# Patient Record
Sex: Female | Born: 1960 | Race: White | Hispanic: No | Marital: Married | Smoking: Former smoker
Health system: Southern US, Community
[De-identification: ages and names within clinical notes are randomized; demographics above are authoritative.]

## PROBLEM LIST (undated history)

## (undated) DIAGNOSIS — J45909 Unspecified asthma, uncomplicated: Secondary | ICD-10-CM

## (undated) DIAGNOSIS — M1990 Unspecified osteoarthritis, unspecified site: Secondary | ICD-10-CM

## (undated) HISTORY — PX: HEMORROIDECTOMY: SUR656

## (undated) HISTORY — DX: Unspecified osteoarthritis, unspecified site: M19.90

## (undated) HISTORY — PX: TONSILLECTOMY AND ADENOIDECTOMY: SHX28

## (undated) HISTORY — DX: Unspecified asthma, uncomplicated: J45.909

## (undated) HISTORY — PX: FOOT SURGERY: SHX648

---

## 2000-05-16 ENCOUNTER — Other Ambulatory Visit: Admission: RE | Admit: 2000-05-16 | Discharge: 2000-05-16 | Payer: Self-pay | Admitting: Obstetrics and Gynecology

## 2001-06-14 ENCOUNTER — Other Ambulatory Visit: Admission: RE | Admit: 2001-06-14 | Discharge: 2001-06-14 | Payer: Self-pay | Admitting: Obstetrics and Gynecology

## 2002-06-20 ENCOUNTER — Emergency Department (HOSPITAL_COMMUNITY): Admission: EM | Admit: 2002-06-20 | Discharge: 2002-06-20 | Payer: Self-pay | Admitting: Emergency Medicine

## 2002-06-20 ENCOUNTER — Encounter: Payer: Self-pay | Admitting: Emergency Medicine

## 2005-11-04 ENCOUNTER — Other Ambulatory Visit: Admission: RE | Admit: 2005-11-04 | Discharge: 2005-11-04 | Payer: Self-pay | Admitting: Obstetrics and Gynecology

## 2005-12-16 ENCOUNTER — Ambulatory Visit (HOSPITAL_BASED_OUTPATIENT_CLINIC_OR_DEPARTMENT_OTHER): Admission: RE | Admit: 2005-12-16 | Discharge: 2005-12-16 | Payer: Self-pay | Admitting: *Deleted

## 2011-08-30 ENCOUNTER — Other Ambulatory Visit: Payer: Self-pay | Admitting: Obstetrics and Gynecology

## 2011-12-28 ENCOUNTER — Other Ambulatory Visit: Payer: Self-pay | Admitting: Family Medicine

## 2011-12-28 DIAGNOSIS — E041 Nontoxic single thyroid nodule: Secondary | ICD-10-CM

## 2011-12-30 ENCOUNTER — Ambulatory Visit
Admission: RE | Admit: 2011-12-30 | Discharge: 2011-12-30 | Disposition: A | Discharge status: Home / Self Care | Payer: BC Managed Care – PPO | Source: Ambulatory Visit | Attending: Family Medicine | Admitting: Family Medicine

## 2011-12-30 DIAGNOSIS — E041 Nontoxic single thyroid nodule: Secondary | ICD-10-CM

## 2012-01-09 ENCOUNTER — Other Ambulatory Visit: Payer: Self-pay | Admitting: Family Medicine

## 2012-01-09 DIAGNOSIS — E041 Nontoxic single thyroid nodule: Secondary | ICD-10-CM

## 2012-01-18 ENCOUNTER — Inpatient Hospital Stay
Admission: RE | Admit: 2012-01-18 | Discharge: 2012-01-18 | Payer: BC Managed Care – PPO | Source: Ambulatory Visit | Attending: Family Medicine | Admitting: Family Medicine

## 2012-01-24 ENCOUNTER — Ambulatory Visit
Admission: RE | Admit: 2012-01-24 | Discharge: 2012-01-24 | Disposition: A | Discharge status: Home / Self Care | Payer: BC Managed Care – PPO | Source: Ambulatory Visit | Attending: Family Medicine | Admitting: Family Medicine

## 2012-01-24 ENCOUNTER — Other Ambulatory Visit (HOSPITAL_COMMUNITY)
Admission: RE | Admit: 2012-01-24 | Discharge: 2012-01-24 | Disposition: A | Discharge status: Home / Self Care | Payer: BC Managed Care – PPO | Source: Ambulatory Visit | Attending: Interventional Radiology | Admitting: Interventional Radiology

## 2012-01-24 DIAGNOSIS — E041 Nontoxic single thyroid nodule: Secondary | ICD-10-CM

## 2012-01-24 DIAGNOSIS — E049 Nontoxic goiter, unspecified: Secondary | ICD-10-CM | POA: Insufficient documentation

## 2013-01-09 ENCOUNTER — Other Ambulatory Visit: Payer: Self-pay | Admitting: Family Medicine

## 2013-01-09 DIAGNOSIS — E041 Nontoxic single thyroid nodule: Secondary | ICD-10-CM

## 2013-01-17 ENCOUNTER — Other Ambulatory Visit: Payer: BC Managed Care – PPO

## 2013-01-24 ENCOUNTER — Ambulatory Visit
Admission: RE | Admit: 2013-01-24 | Discharge: 2013-01-24 | Disposition: A | Discharge status: Home / Self Care | Payer: BC Managed Care – PPO | Source: Ambulatory Visit | Attending: Family Medicine | Admitting: Family Medicine

## 2013-01-24 DIAGNOSIS — E041 Nontoxic single thyroid nodule: Secondary | ICD-10-CM

## 2013-10-30 IMAGING — US US SOFT TISSUE HEAD/NECK
1 series · 14 of 25 positions shown · non-contrast
Comparison: None.

CLINICAL DATA: Palpable left sided nodule

THYROID ULTRASOUND
TECHNIQUE: Ultrasound examination of the thyroid gland and adjacent
soft tissues was performed.

[Series 1: us soft tissue head/neck · 0.03mm/px · 14 of 55 slices shown]
[im 1/55]
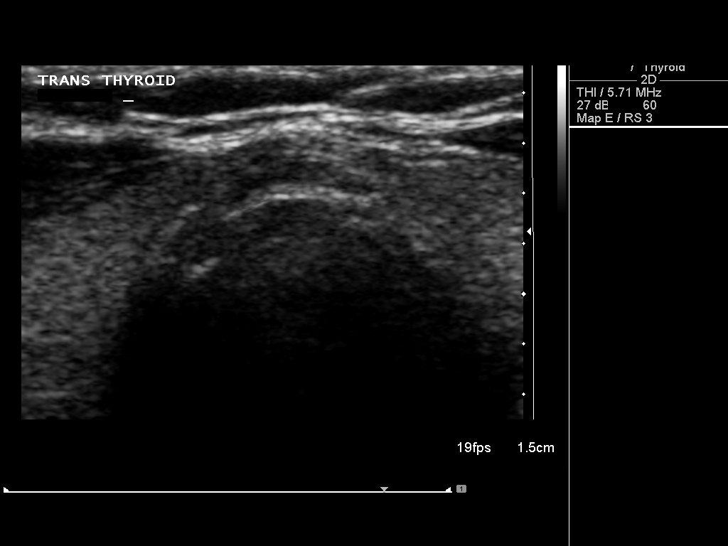
[im 5/55]
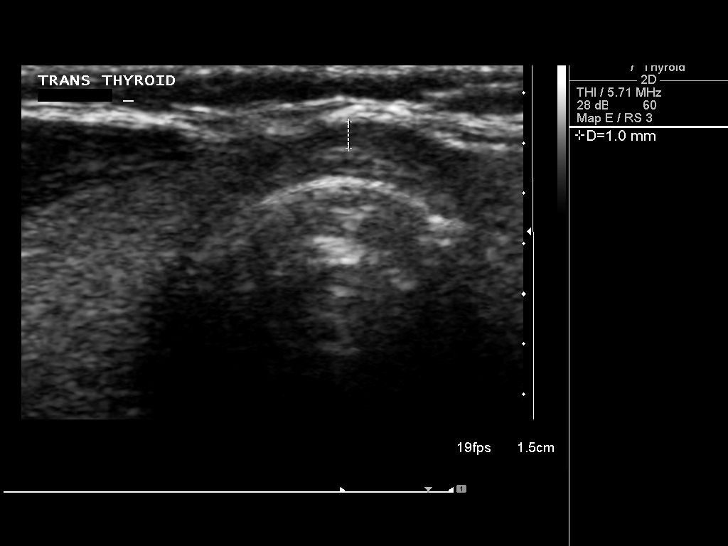
[im 10/55]
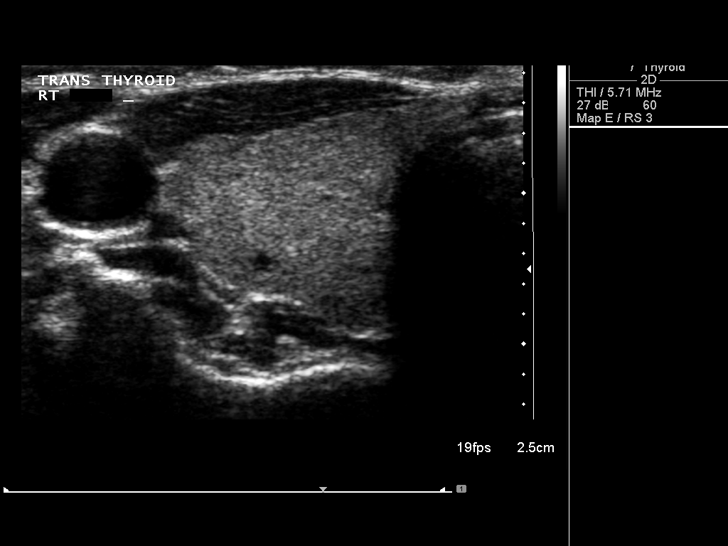
[im 14/55]
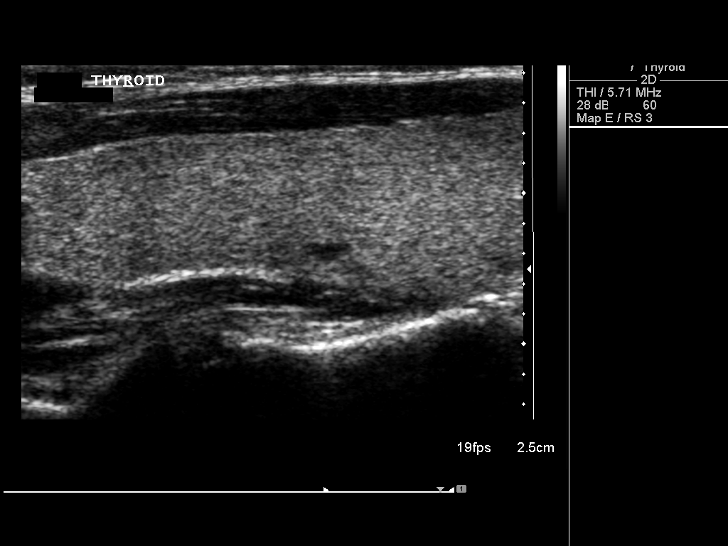
[im 19/55]
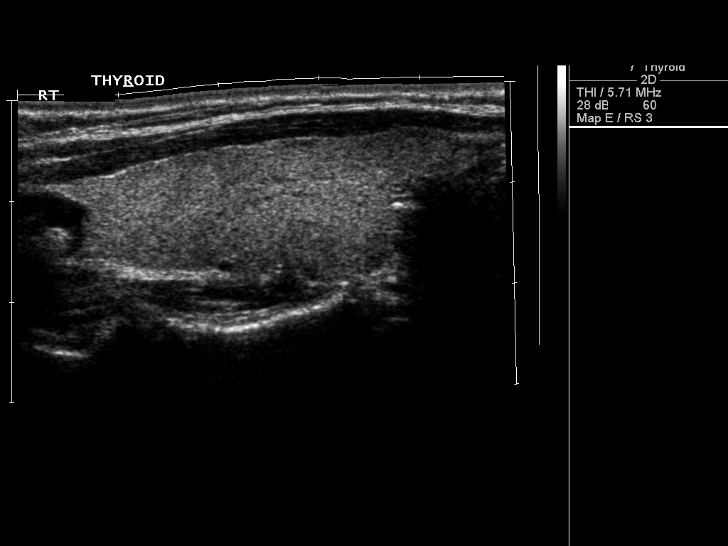
[im 21/55]
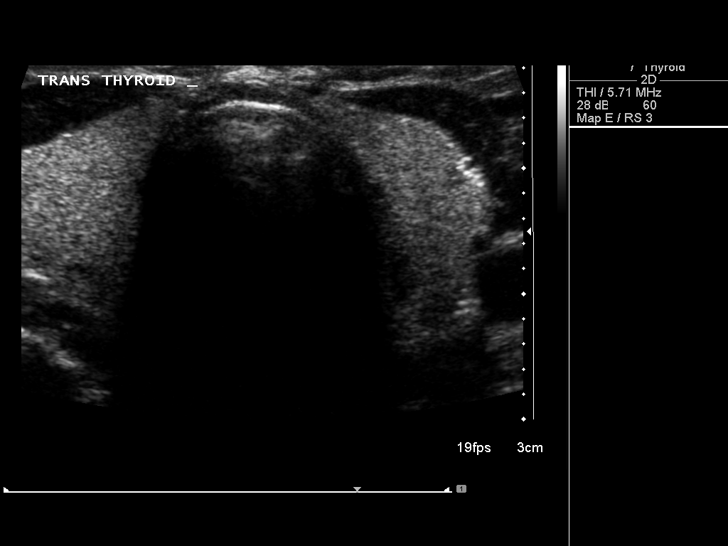
[im 25/55]
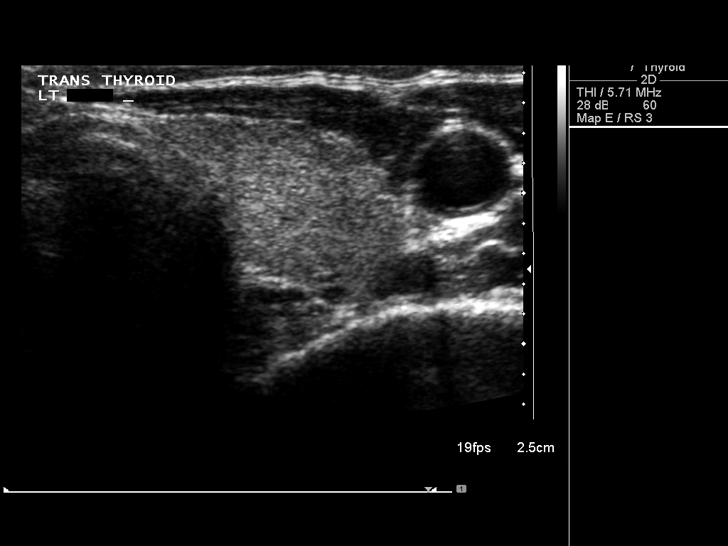
[im 30/55]
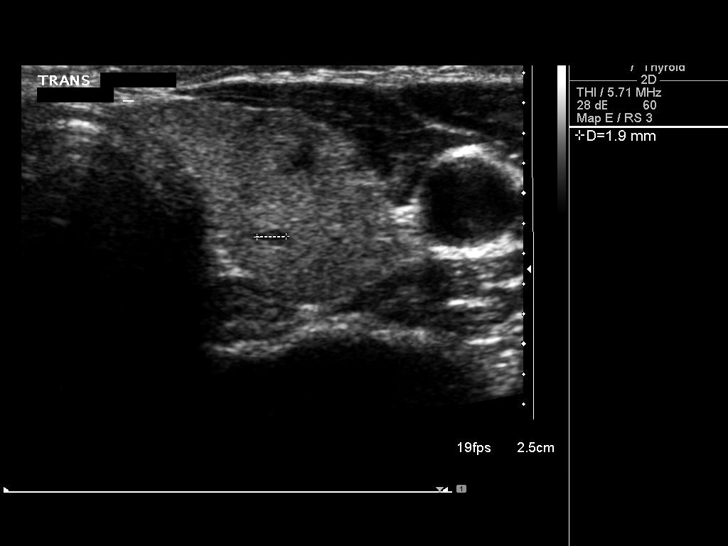
[im 34/55]
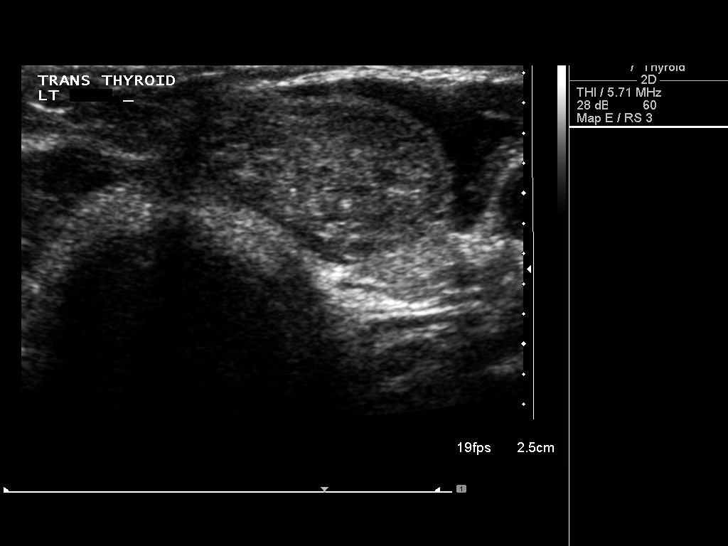
[im 37/55]
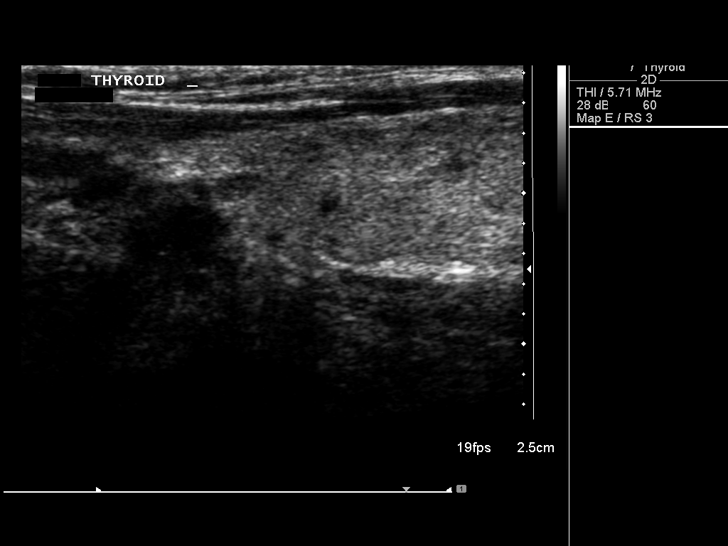
[im 41/55]
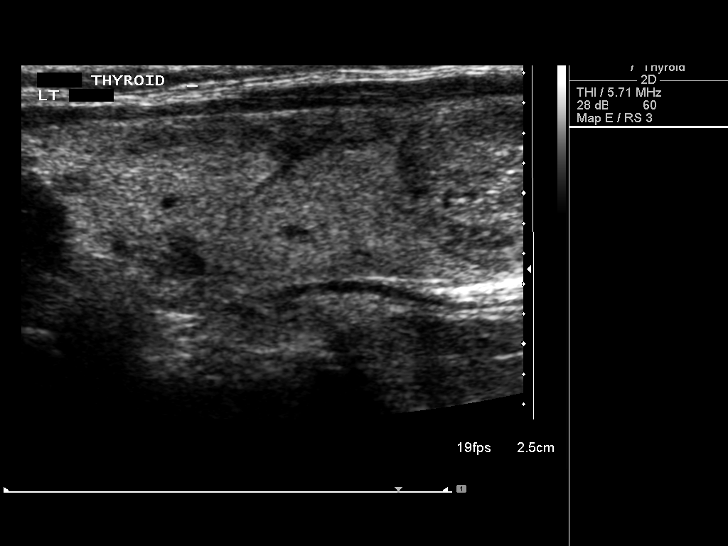
[im 46/55]
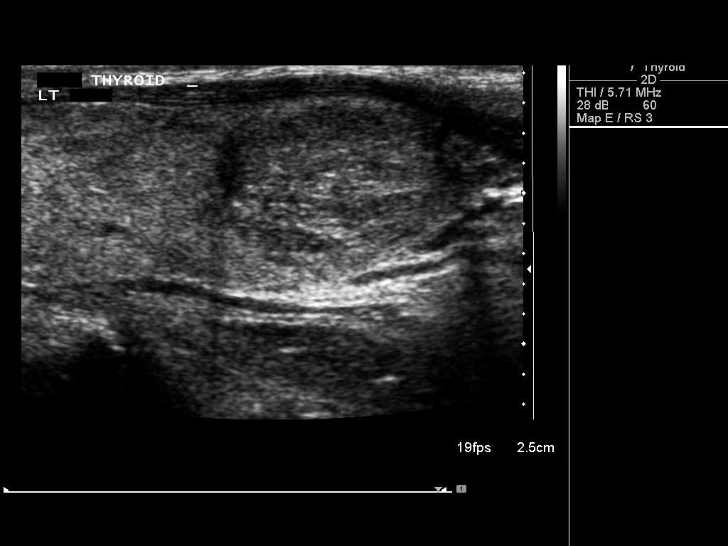
[im 50/55]
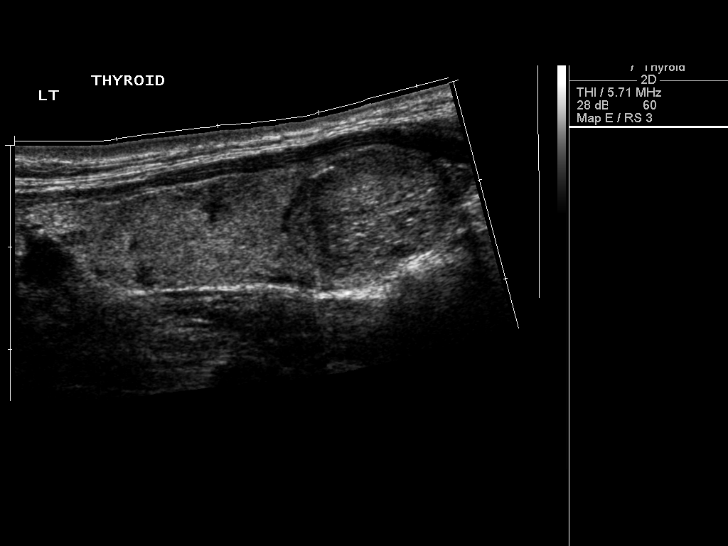
[im 55/55]
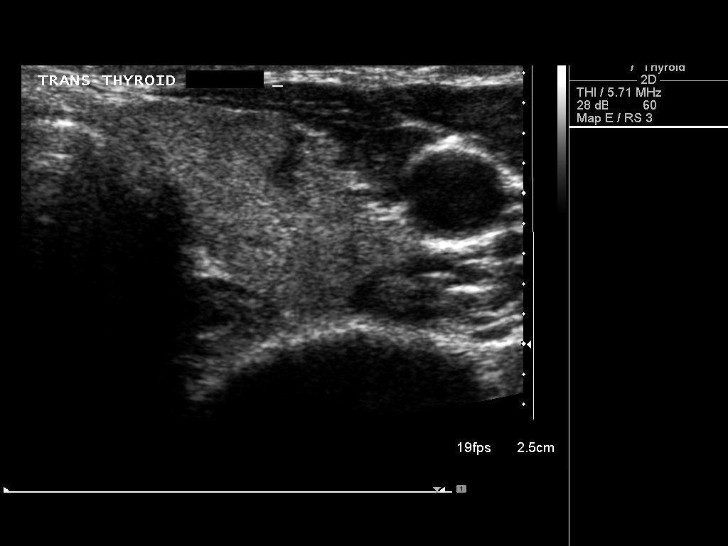

[14 of 25 positions shown; findings below may reference images not displayed]

FINDINGS: Right thyroid lobe:  14 x 15 x 41 mm, homogeneous in echotexture
Left thyroid lobe:  13 x 14 x 42 mm
Isthmus:  1 mm in thickness

Focal nodules:  11 x 16 x 17 mm solid with microcalcifications,
inferior left

Lymphadenopathy:  None visualized.
IMPRESSION: 1.  Dominant 17 mm left thyroid nodule with microcalcifications.
Findings meet consensus criteria for biopsy.  Ultrasound-guided
fine needle aspiration should be considered, as per the consensus
statement: Management of Thyroid Nodules Detected at US:  Society
of Radiologists in Ultrasound Consensus Conference Statement.

## 2013-10-31 ENCOUNTER — Other Ambulatory Visit: Payer: Self-pay | Admitting: Obstetrics and Gynecology

## 2013-11-24 IMAGING — US US THYROID BIOPSY
1 series · 11 of 11 positions shown · non-contrast
Comparison: 12/30/2011

CLINICAL DATA: 1.7 cm solid nodule in the left lobe of the thyroid
gland.

ULTRASOUND GUIDED NEEDLE ASPIRATE BIOPSY OF THE THYROID GLAND

[Series 1: us thyroid biopsy · 0.05mm/px · 11 acquisitions, 11 frames shown]
[im 1/11]
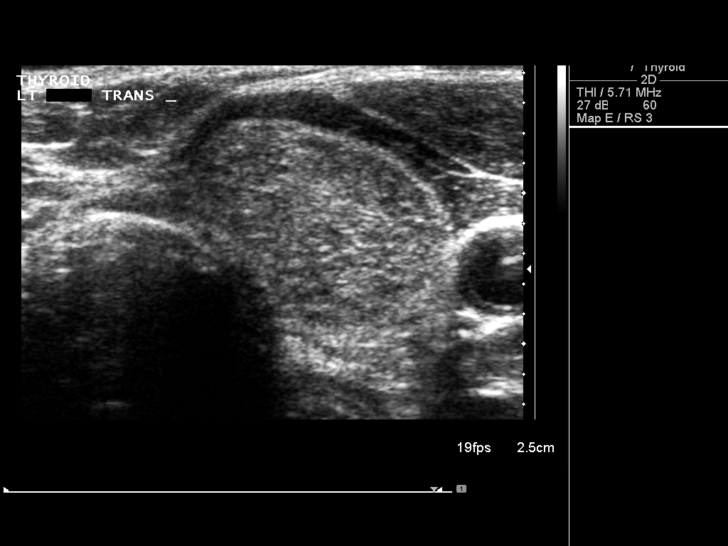
[im 2/11]
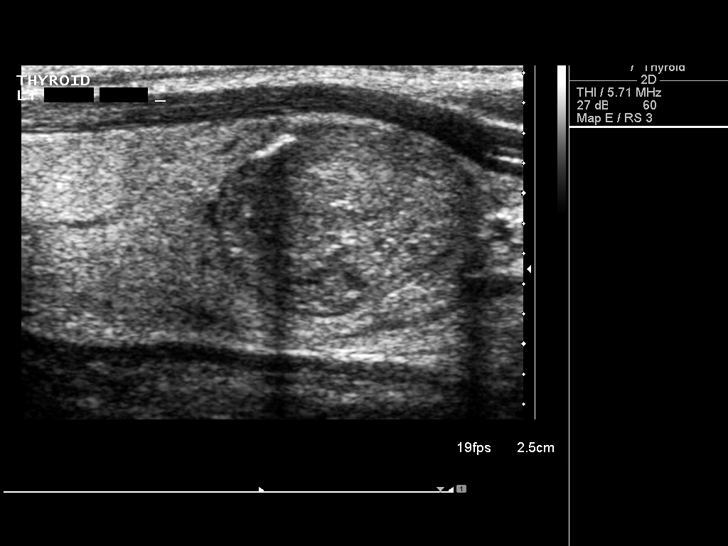
[im 3/11]
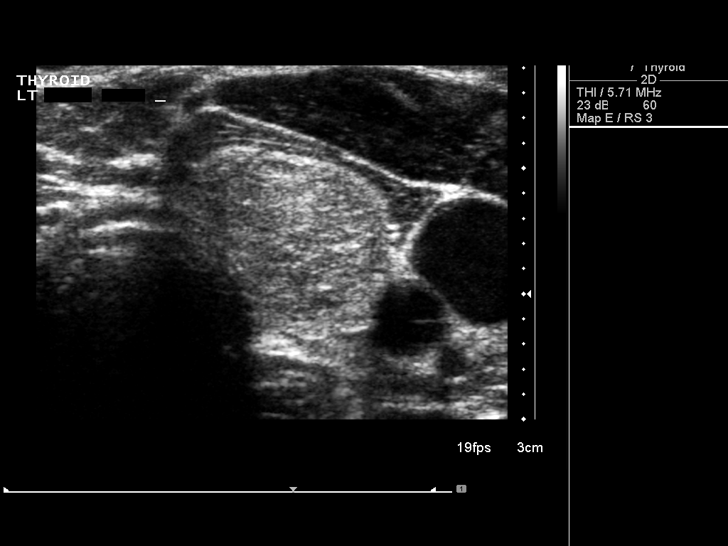
[im 4/11]
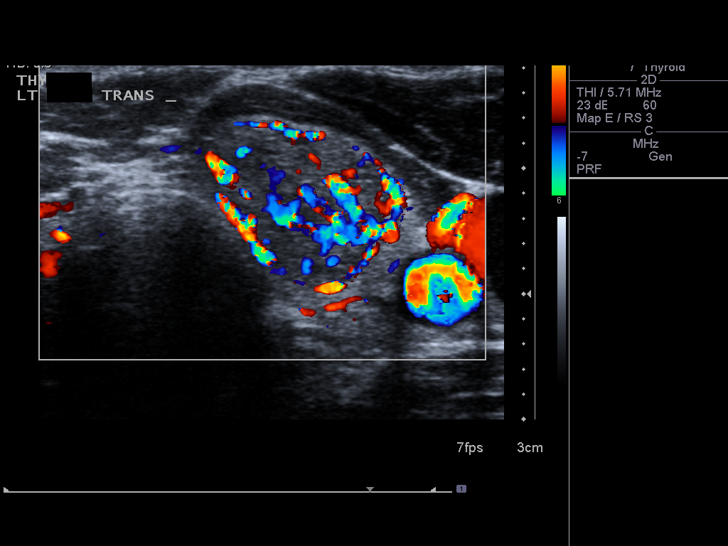
[im 5/11]
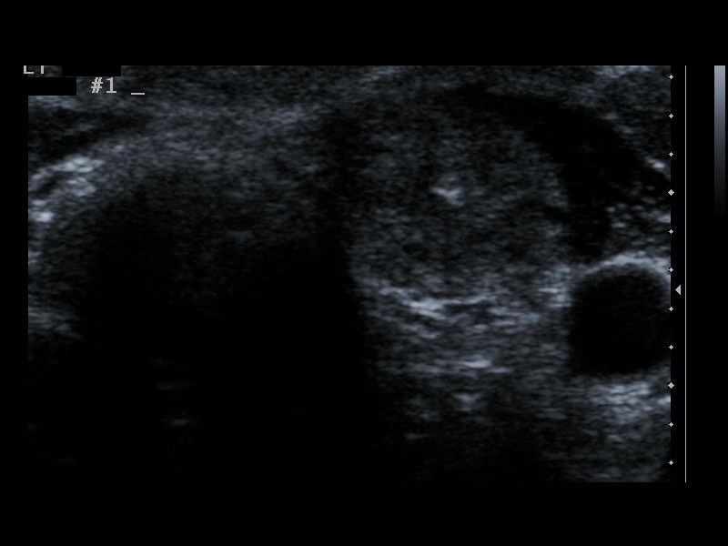
[im 6/11]
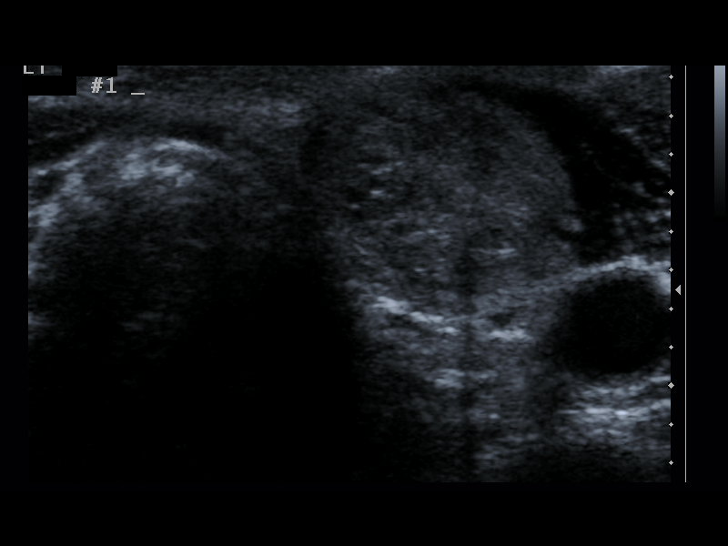
[im 7/11]
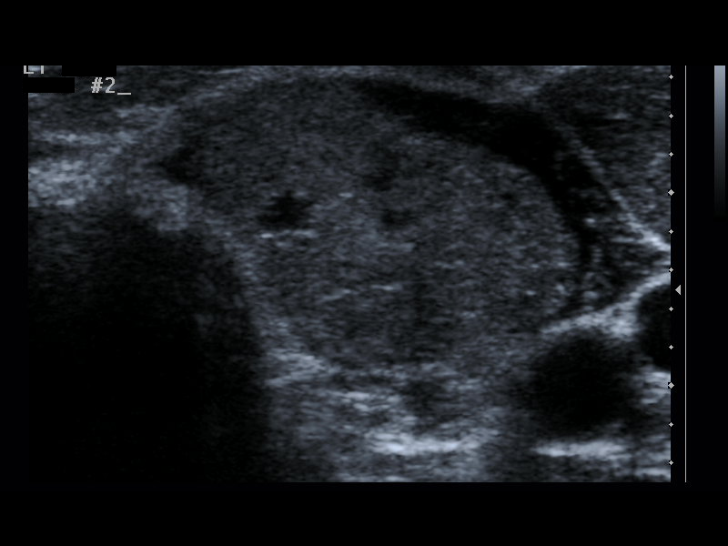
[im 8/11]
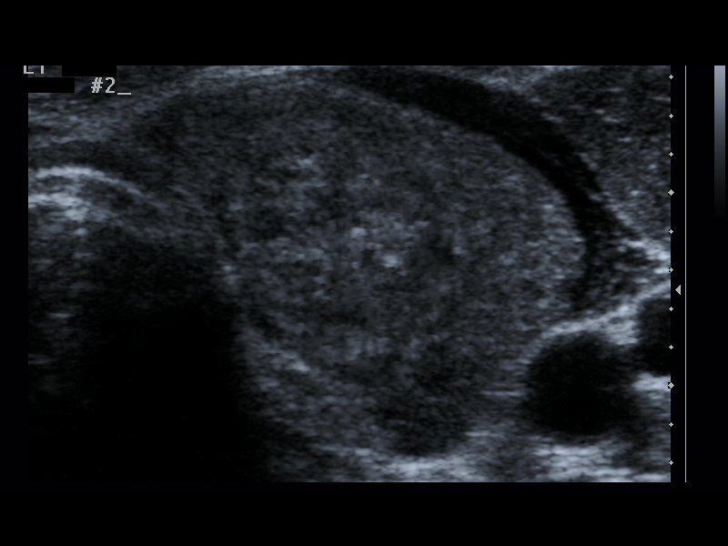
[im 9/11]
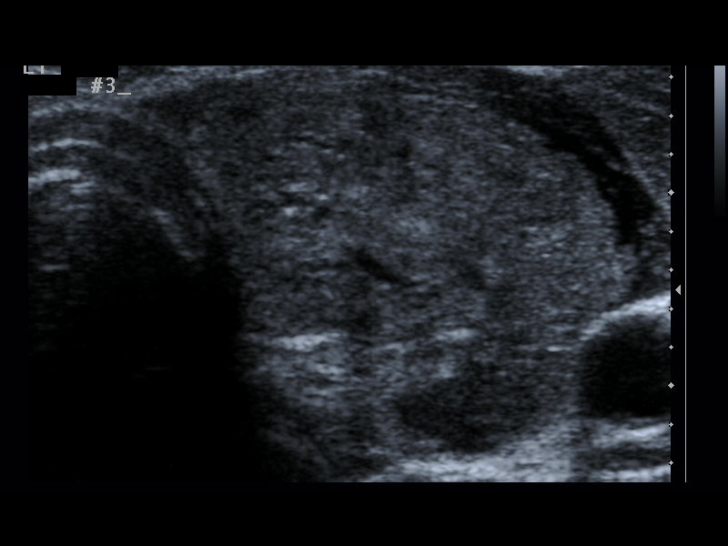
[im 10/11]
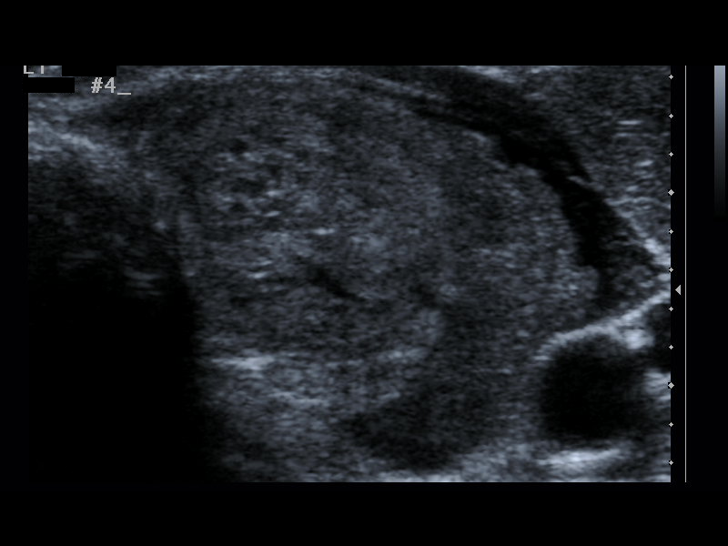
[im 11/11]
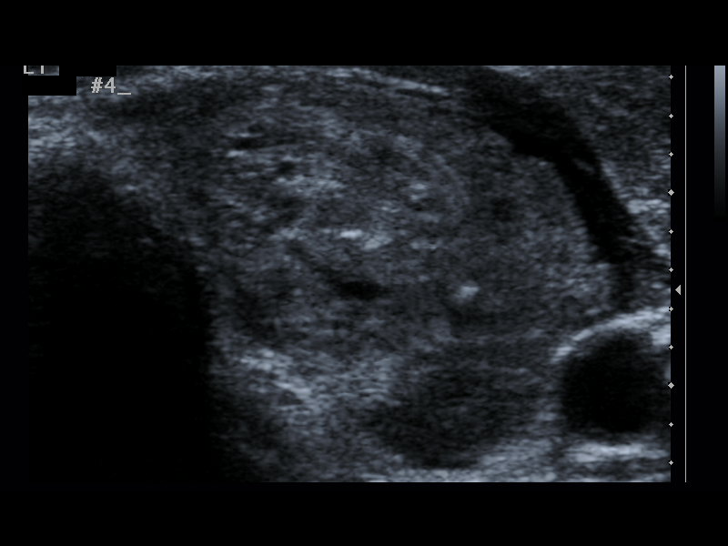

[11 of 11 positions shown; findings below may reference images not displayed]

Thyroid biopsy was thoroughly discussed with the patient and
questions were answered.  The benefits, risks, alternatives, and
complications were also discussed.  The patient understands and
wishes to proceed with the procedure.  Written consent was
obtained.

Ultrasound was performed to localize and mark an adequate site for
the biopsy.  The patient was then prepped and draped in a normal
sterile fashion.  Local anesthesia was provided with 1% lidocaine.
Using direct ultrasound guidance, 4 passes were made using 25 gauge
needles into the nodule within the left lobe of the thyroid.
Ultrasound was used to confirm needle placements on all occasions.
Specimens were sent to Pathology for analysis.

Complications:  Mild amount of hemorrhage developing around the
thyroid gland.
FINDINGS: Needle passes were obtained in different portions of the
dominant left thyroid nodule. During the procedure ultrasound did
show a mild amount of hemorrhage developing superficial to the
thyroid gland.  This was treated with compression and an ice pack
after the procedure.  The patient was not symptomatic.
IMPRESSION: Ultrasound guided needle aspirate biopsy performed of the left
thyroid nodule.

## 2014-03-25 ENCOUNTER — Ambulatory Visit (INDEPENDENT_AMBULATORY_CARE_PROVIDER_SITE_OTHER): Payer: BC Managed Care – PPO | Admitting: Surgery

## 2014-03-26 ENCOUNTER — Ambulatory Visit (INDEPENDENT_AMBULATORY_CARE_PROVIDER_SITE_OTHER): Payer: BC Managed Care – PPO | Admitting: General Surgery

## 2014-03-26 ENCOUNTER — Encounter (INDEPENDENT_AMBULATORY_CARE_PROVIDER_SITE_OTHER): Payer: Self-pay | Admitting: General Surgery

## 2014-03-26 VITALS — BP 124/80 | HR 64 | Temp 98.0°F | Ht 65.0 in | Wt 141.0 lb

## 2014-03-26 DIAGNOSIS — K6289 Other specified diseases of anus and rectum: Secondary | ICD-10-CM

## 2014-03-26 MED ORDER — HYDROCORTISONE ACETATE 25 MG RE SUPP: 25.0000 mg | Freq: Two times a day (BID) | RECTAL | Status: DC

## 2014-03-26 NOTE — Progress Notes (Signed)
Chief Complaint  Patient presents with  . eval hems    HISTORY: Elizabeth Mullen is a 53 y.o. female who presents to the office with anal discomfort after BM's.  Other symptoms include nothing.  This had been occurring for several years.  she has tried nothing in the past.  Nothing makes the symptoms worse.   It is continuous in nature.  her bowel habits are regular and her bowel movements are usually soft.  her fiber intake is dietary.  She eats plenty of vegetables every day.  her last colonoscopy was 3 years ago and is due again in 7 yrs.       Past Medical History  Diagnosis Date  . Arthritis   . Asthma       Past Surgical History  Procedure Laterality Date  . Cesarean section    . Tonsillectomy and adenoidectomy    . Foot surgery    . Hemorroidectomy         Back surgery: 1986  Current Outpatient Prescriptions  Medication Sig Dispense Refill  . HYDROcodone-acetaminophen (NORCO/VICODIN) 5-325 MG per tablet       . temazepam (RESTORIL) 30 MG capsule        No current facility-administered medications for this visit.      Allergies  Allergen Reactions  . Contrast Media [Iodinated Diagnostic Agents]       History reviewed. No pertinent family history.  History   Social History  . Marital Status: Married    Spouse Name: N/A    Number of Children: N/A  . Years of Education: N/A   Social History Main Topics  . Smoking status: Former Smoker    Types: Cigarettes    Quit date: 03/27/1999  . Smokeless tobacco: None  . Alcohol Use: Yes  . Drug Use: No  . Sexual Activity: None   Other Topics Concern  . None   Social History Narrative  . None      REVIEW OF SYSTEMS - PERTINENT POSITIVES ONLY: Review of Systems - General ROS: negative for - chills, fever or weight loss Hematological and Lymphatic ROS: negative for - bleeding problems, blood clots or bruising Respiratory ROS: no cough, shortness of breath, or wheezing Cardiovascular ROS: no chest pain or dyspnea on  exertion Gastrointestinal ROS: no abdominal pain, change in bowel habits, or black or bloody stools Genito-Urinary ROS: no dysuria, trouble voiding, or hematuria  EXAM: Filed Vitals:   03/26/14 1541  BP: 124/80  Pulse: 64  Temp: 98 F (36.7 C)    General appearance: alert and cooperative Resp: clear to auscultation bilaterally Cardio: regular rate and rhythm GI: soft, non-tender; bowel sounds normal; no masses,  no organomegaly  Procedure: Anoscopy Surgeon: Maisie Fushomas Diagnosis: anal discomfort  Assistant: Felix AhmadiStaten After the risks and benefits were explained, verbal consent was obtained for above procedure  Anesthesia: none Findings: inflamed, grade 1 internal hemorrhoids, moderate non-inflamed external skin tags in a circumferential fashion    ASSESSMENT AND PLAN: Elizabeth MeringSharee Henton is a 53 y.o. female with external skin tag and inflamed grade 1 internal hemorrhoids and large recurrent skin tags after her hemorrhoidectomy several years ago.  Given that her inflammation is mostly internal, I will have her try anusol suppositories first.  It appears that she is getting a good amount of fiber and liquids. I will see her back in 2 months.       Vanita PandaAlicia C Thomas, MD Colon and Rectal Surgery / General Surgery [facility]  Visit Diagnoses: No diagnosis found.  Primary Care Physician: No primary provider on file.

## 2014-03-26 NOTE — Patient Instructions (Addendum)

## 2014-05-27 ENCOUNTER — Encounter (INDEPENDENT_AMBULATORY_CARE_PROVIDER_SITE_OTHER): Payer: BC Managed Care – PPO | Admitting: General Surgery

## 2014-11-25 IMAGING — US US SOFT TISSUE HEAD/NECK
1 series · 14 of 25 positions shown · non-contrast
Comparison: Thyroid ultrasound 12/30/2011

CLINICAL DATA: Follow up thyroid nodule

THYROID ULTRASOUND
TECHNIQUE: Ultrasound examination of the thyroid gland and adjacent
soft tissues was performed.

[Series 1: us soft tissue head/neck · 0.06mm/px · 14 of 45 slices shown]
[im 1/45]
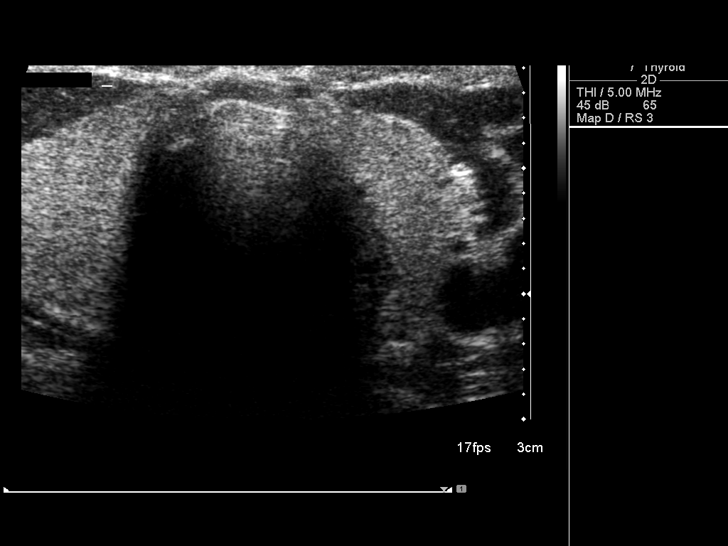
[im 4/45]
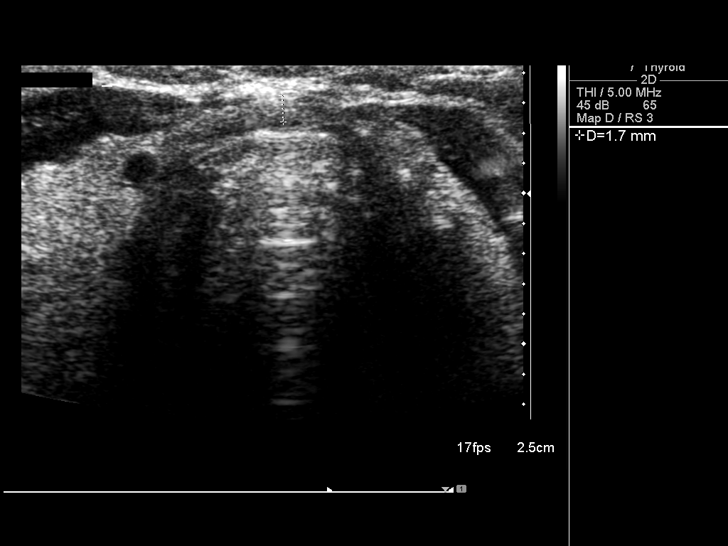
[im 8/45]
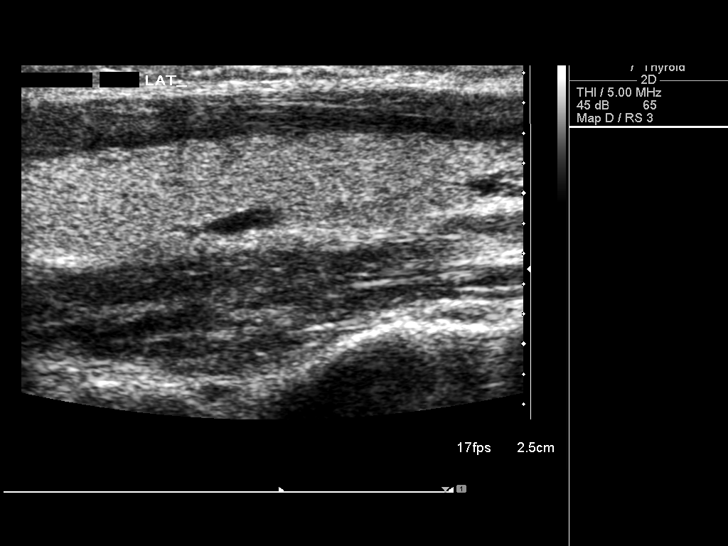
[im 12/45]
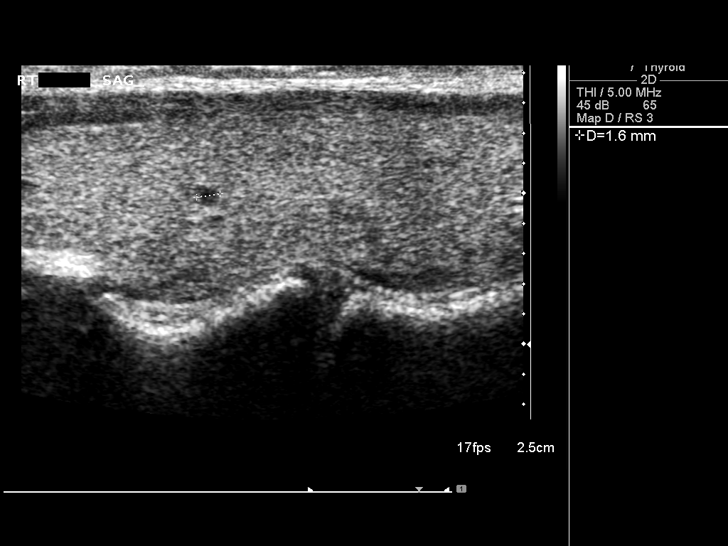
[im 15/45]
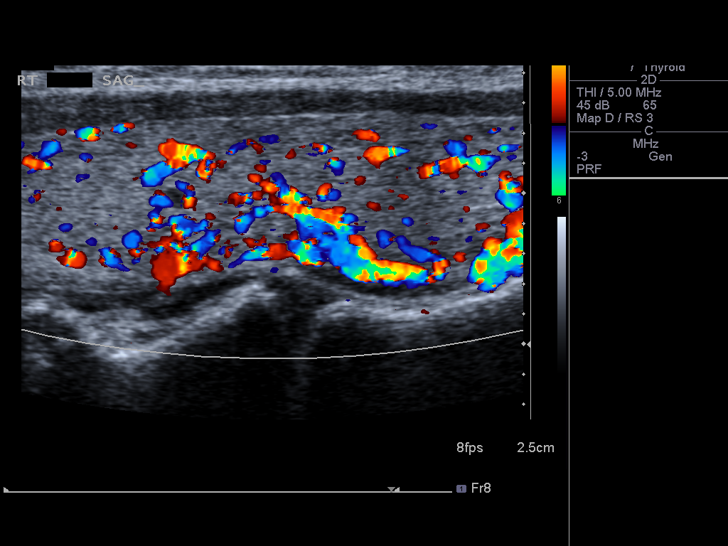
[im 17/45]
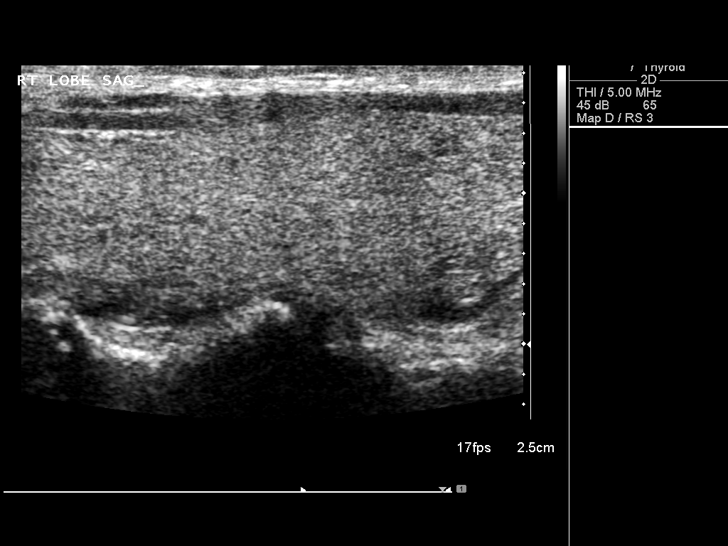
[im 21/45]
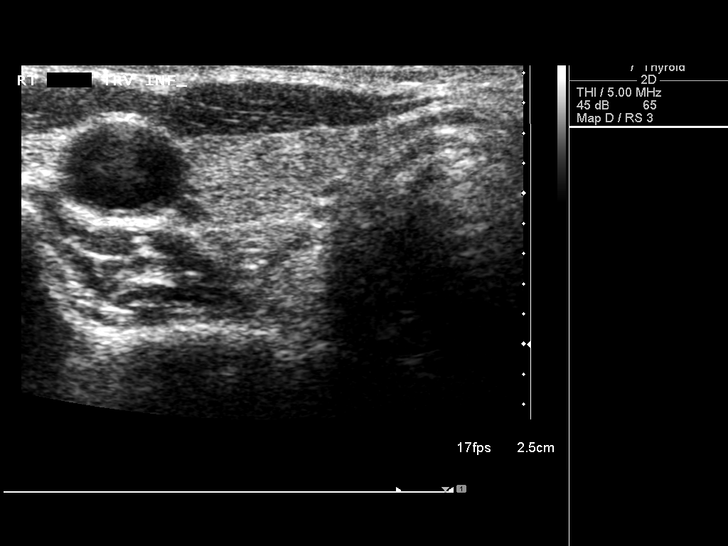
[im 24/45]
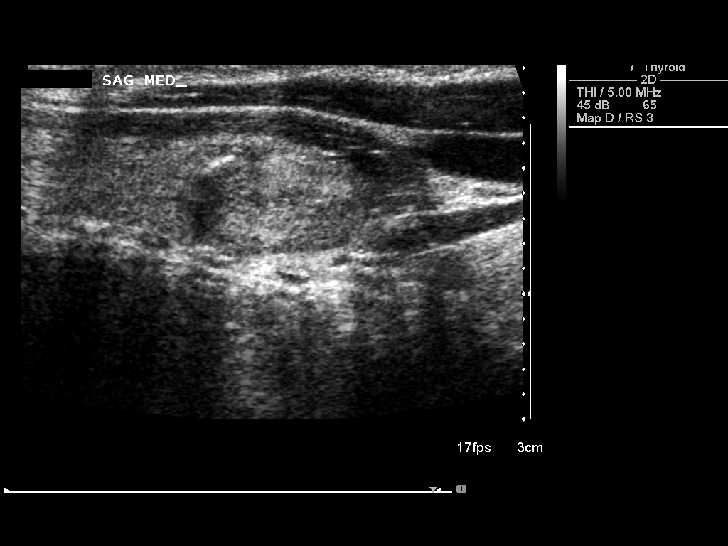
[im 28/45]
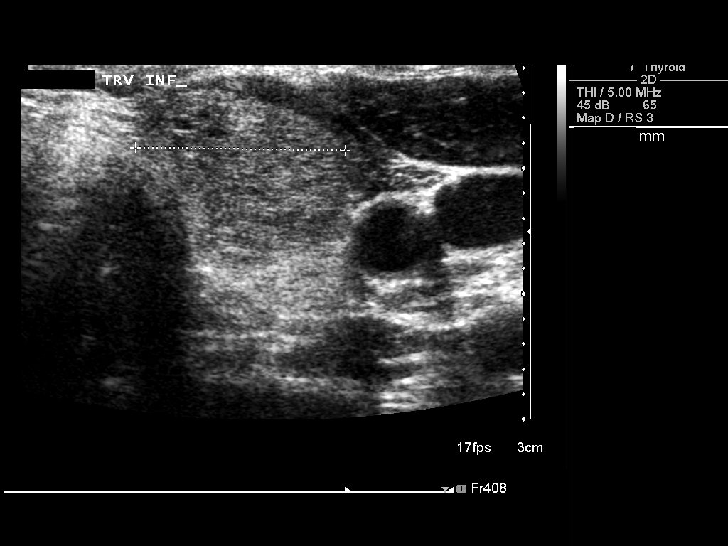
[im 30/45]
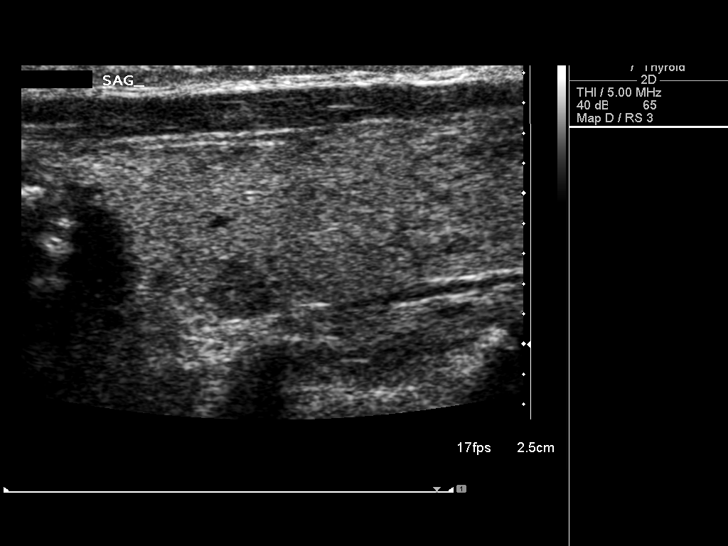
[im 34/45]
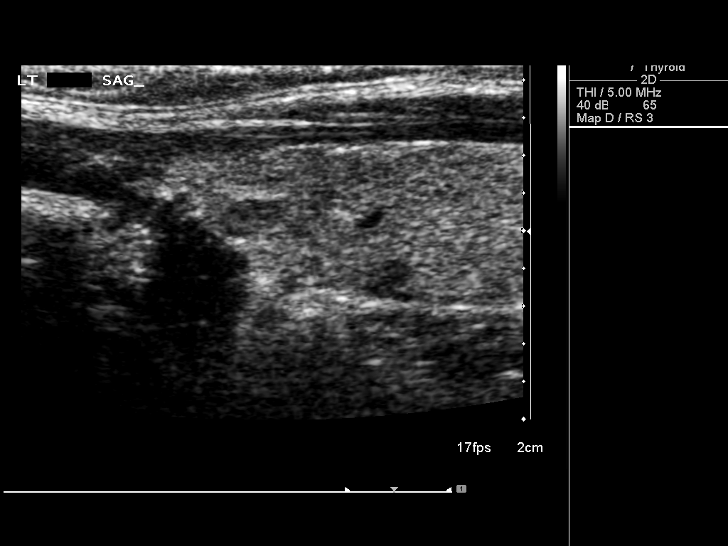
[im 37/45]
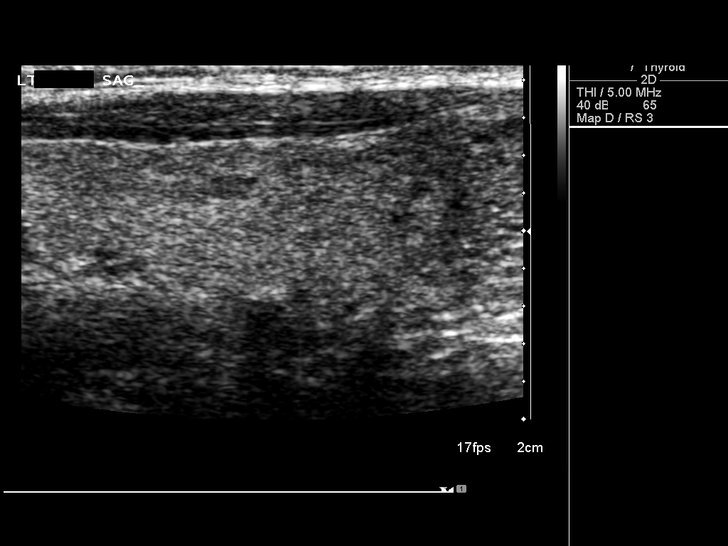
[im 41/45]
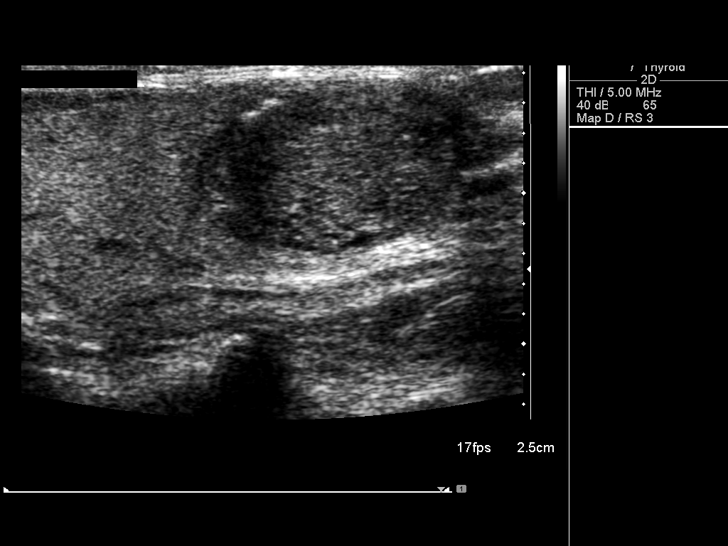
[im 45/45]
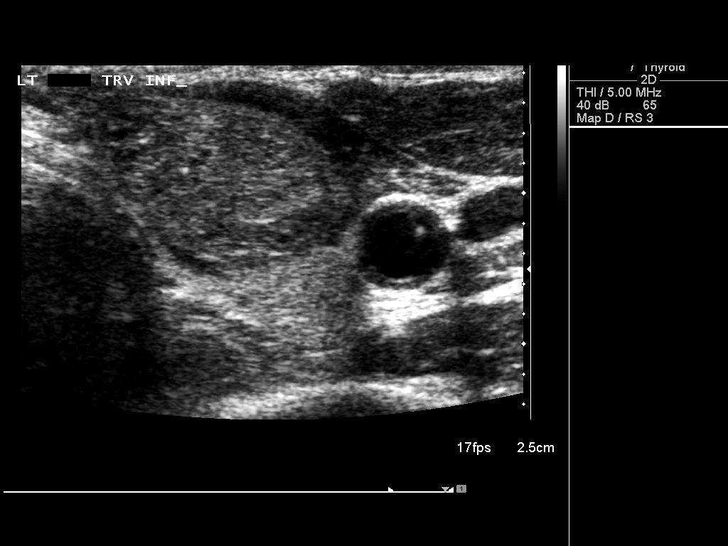

[14 of 25 positions shown; findings below may reference images not displayed]

FINDINGS: Right thyroid lobe:  4.1 x 1.4 x 1.5 cm
Left thyroid lobe:  4.7 x 1.4 x 1.4 cm
Isthmus:  0.2 cm

Focal nodules:  Solid nodule left lower pole measures 18 x 11 x 17
mm and contains small calcifications.  This is unchanged from the
prior study.

Additional small 4 and 5 mm nodules are present bilaterally and
unchanged.

Lymphadenopathy:  None visualized.
IMPRESSION: Dominant solid nodule left lower thyroid is unchanged.  Biopsy is
suggested if not already  performed.

Findings meet consensus criteria for biopsy.  Ultrasound-guided
fine needle aspiration should be considered, as per the consensus
statement: Management of Thyroid Nodules Detected at US:  Society
of Radiologists in Ultrasound Consensus Conference Statement.

## 2015-01-19 ENCOUNTER — Other Ambulatory Visit: Payer: Self-pay | Admitting: Family Medicine

## 2015-01-19 DIAGNOSIS — E041 Nontoxic single thyroid nodule: Secondary | ICD-10-CM

## 2015-02-02 ENCOUNTER — Other Ambulatory Visit: Payer: Self-pay

## 2016-09-17 ENCOUNTER — Ambulatory Visit (INDEPENDENT_AMBULATORY_CARE_PROVIDER_SITE_OTHER): Payer: Self-pay | Admitting: Family Medicine

## 2016-09-17 ENCOUNTER — Other Ambulatory Visit: Payer: Self-pay | Admitting: Family Medicine

## 2016-09-17 ENCOUNTER — Telehealth: Payer: Self-pay | Admitting: *Deleted

## 2016-09-17 VITALS — BP 118/72 | HR 64 | Temp 98.5°F | Resp 18 | Ht 65.0 in | Wt 131.0 lb

## 2016-09-17 DIAGNOSIS — H66002 Acute suppurative otitis media without spontaneous rupture of ear drum, left ear: Secondary | ICD-10-CM

## 2016-09-17 DIAGNOSIS — H60392 Other infective otitis externa, left ear: Secondary | ICD-10-CM

## 2016-09-17 DIAGNOSIS — J4520 Mild intermittent asthma, uncomplicated: Secondary | ICD-10-CM | POA: Insufficient documentation

## 2016-09-17 MED ORDER — NEOMYCIN-POLYMYXIN-HC 1 % OT SOLN: 3.0000 [drp] | Freq: Four times a day (QID) | OTIC | 0 refills | Status: AC

## 2016-09-17 MED ORDER — OXYCODONE-ACETAMINOPHEN 5-325 MG PO TABS: 1.0000 | ORAL_TABLET | ORAL | 0 refills | Status: AC | PRN

## 2016-09-17 MED ORDER — CIPROFLOXACIN-HYDROCORTISONE 0.2-1 % OT SUSP: 3.0000 [drp] | Freq: Two times a day (BID) | OTIC | 0 refills | Status: DC

## 2016-09-17 MED ORDER — AMOXICILLIN 875 MG PO TABS: 875.0000 mg | ORAL_TABLET | Freq: Two times a day (BID) | ORAL | 0 refills | Status: AC

## 2016-09-17 NOTE — Patient Instructions (Addendum)
IF you received an x-ray today, you will receive an invoice from [facility]. Please contact [facility] at [phone] with questions or concerns regarding your invoice.   IF you received labwork today, you will receive an invoice from [facility]. Please contact [facility] at [phone] with questions or concerns regarding your invoice.   Our billing staff will not be able to assist you with questions regarding bills from these companies.  You will be contacted with the lab results as soon as they are available. The fastest way to get your results is to activate your My Chart account. Instructions are located on the last page of this paperwork. If you have not heard from us regarding the results in 2 weeks, please contact this office.     Otitis Externa Otitis externa is a germ infection in the outer ear. The outer ear is the area from the eardrum to the outside of the ear. Otitis externa is sometimes called "swimmer's ear." HOME CARE  Put drops in the ear as told by your doctor.  Only take medicine as told by your doctor.  If you have diabetes, your doctor may give you more directions. Follow your doctor's directions.  Keep all doctor visits as told. To avoid another infection:  Keep your ear dry. Use the corner of a towel to dry your ear after swimming or bathing.  Avoid scratching or putting things inside your ear.  Avoid swimming in lakes, dirty water, or pools that use a chemical called chlorine poorly.  You may use ear drops after swimming. Combine equal amounts of white vinegar and alcohol in a bottle. Put 3 or 4 drops in each ear. GET HELP IF:   You have a fever.  Your ear is still red, puffy (swollen), or painful after 3 days.  You still have yellowish-white fluid (pus) coming from the ear after 3 days.  Your redness, puffiness, or pain gets worse.  You have a really bad headache.  You have redness, puffiness, pain, or tenderness behind  your ear. MAKE SURE YOU:   Understand these instructions.  Will watch your condition.  Will get help right away if you are not doing well or get worse. This information is not intended to replace advice given to you by your health care provider. Make sure you discuss any questions you have with your health care provider. Document Released: 03/07/2008 Document Revised: 10/10/2014 Document Reviewed: 06/29/2015 Elsevier Interactive Patient Education  2017 [employer]vier Inc.  Otitis Media, Adult Otitis media is redness, soreness, and puffiness (swelling) in the space just behind your eardrum (middle ear). It may be caused by allergies or infection. It often happens along with a cold. Follow these instructions at home:  Take your medicine as told. Finish it even if you start to feel better.  Only take over-the-counter or prescription medicines for pain, discomfort, or fever as told by your doctor.  Follow up with your doctor as told. Contact a doctor if:  You have otitis media only in one ear, or bleeding from your nose, or both.  You notice a lump on your neck.  You are not getting better in 3-5 days.  You feel worse instead of better. Get help right away if:  You have pain that is not helped with medicine.  You have puffiness, redness, or pain around your ear.  You get a stiff neck.  You cannot move part of your face (paralysis).  You notice that the bone behind your ear  hurts when you touch it. This information is not intended to replace advice given to you by your health care provider. Make sure you discuss any questions you have with your health care provider. Document Released: 03/07/2008 Document Revised: 02/25/2016 Document Reviewed: 04/16/2013 Elsevier Interactive Patient Education  2017 [employer]

## 2016-09-17 NOTE — Telephone Encounter (Signed)
[facility] advise ear drops Rx without insurance is $300. He suggested cortisporin which is cheaper. I spoke to Dr Creta LevinStallings and it is fine to change.

## 2016-09-17 NOTE — Progress Notes (Signed)
Changed cipro hc otic to cortisporin otic due to cost

## 2016-09-17 NOTE — Progress Notes (Signed)
Chief Complaint  Patient presents with  . Ear Pain    left     HPI  Pt reports increasing ear pain starting at 3am work her from her sleep Pain is in the left ear and is now moving down to her jaw and neck She denies fevers or chills.  She denies any other symptoms and felt fine when she went to bed. She started feeling a sensation in her air "like fluid that is moving around". She took acetaminophen.   Mild intermittent asthma She reports that she was not having any issues with her asthma She uses her pulmicort prn and ventolin prn She used her ventolin this week once or twice because of weather changes She denies ER visits due to asthma and feels like her asthma is controlled.  She stretches her pulmicort so that it lasts longer.  Past Medical History:  Diagnosis Date  . Arthritis   . Asthma     Current Outpatient Prescriptions  Medication Sig Dispense Refill  . amoxicillin (AMOXIL) 875 MG tablet Take 1 tablet (875 mg total) by mouth 2 (two) times daily. 20 tablet 0  . ciprofloxacin-hydrocortisone (CIPRO HC) otic suspension Place 3 drops into the left ear 2 (two) times daily. 10 mL 0  . HYDROcodone-acetaminophen (NORCO/VICODIN) 5-325 MG per tablet     . oxyCODONE-acetaminophen (PERCOCET/ROXICET) 5-325 MG tablet Take 1 tablet by mouth every 4 (four) hours as needed for severe pain. 4 tablet 0  . temazepam (RESTORIL) 30 MG capsule      No current facility-administered medications for this visit.     Allergies:  Allergies  Allergen Reactions  . Contrast Media [Iodinated Diagnostic Agents]     Past Surgical History:  Procedure Laterality Date  . CESAREAN SECTION    . FOOT SURGERY    . HEMORROIDECTOMY    . TONSILLECTOMY AND ADENOIDECTOMY      Social History   Social History  . Marital status: Married    Spouse name: N/A  . Number of children: N/A  . Years of education: N/A   Social History Main Topics  . Smoking status: Former Smoker    Types: Cigarettes      Quit date: 03/27/1999  . Smokeless tobacco: Never Used  . Alcohol use Yes  . Drug use: No  . Sexual activity: Not Asked   Other Topics Concern  . None   Social History Narrative  . None    ROS  Objective: Vitals:   09/17/16 0820  BP: 118/72  Pulse: 64  Resp: 18  Temp: 98.5 F (36.9 C)  TempSrc: Oral  SpO2: 99%  Weight: 131 lb (59.4 kg)  Height: 5\' 5"  (1.651 m)    Physical Exam  Constitutional: She is oriented to person, place, and time. She appears well-developed and well-nourished.  HENT:  Head: Normocephalic and atraumatic.  Right Ear: Hearing, tympanic membrane, external ear and ear canal normal.  Left Ear: Hearing normal. No lacerations. There is drainage, swelling and tenderness. No foreign bodies. No mastoid tenderness. Tympanic membrane is bulging. Tympanic membrane is not injected, not scarred, not perforated, not erythematous and not retracted. Tympanic membrane mobility is abnormal.  No middle ear effusion. No hemotympanum. No decreased hearing is noted.  Blood noted in the external ear canal but no visible laceration  Eyes: Conjunctivae and EOM are normal.  Cardiovascular: Normal rate, regular rhythm and normal heart sounds.   No murmur heard. Pulmonary/Chest: Effort normal and breath sounds normal. No respiratory distress. She has  no wheezes.  Musculoskeletal: Normal range of motion.  Neurological: She is oriented to person, place, and time. No cranial nerve deficit. Coordination normal.  Skin: Skin is warm.  Psychiatric: She has a normal mood and affect. Her behavior is normal. Judgment and thought content normal.    Assessment and Plan Chase PicketSharee was seen today for ear pain.  Diagnoses and all orders for this visit:  Acute suppurative otitis media of left ear without spontaneous rupture of tympanic membrane, recurrence not specified Other infective acute otitis externa of left ear  - advised pt not to use anything smaller than her finger in the  ear -  Gave antibiotic otic and systemic Discussed that if there is worsening edema in the external ear she may need an ear wick so she should return to clinic for worsening symptoms Advised perocet prn pain Tylenol after completing percocet -     ciprofloxacin-hydrocortisone (CIPRO HC) otic suspension; Place 3 drops into the left ear 2 (two) times daily. -     amoxicillin (AMOXIL) 875 MG tablet; Take 1 tablet (875 mg total) by mouth 2 (two) times daily. -     oxyCODONE-acetaminophen (PERCOCET/ROXICET) 5-325 MG tablet; Take 1 tablet by mouth every 4 (four) hours as needed for severe pain.  Mild intermittent asthma without complication Advised pt that since her symptoms were stable Her exam was clear She should continue ventolin Discussed that prn pulmicort is not giving her much benefit so she should stop the pulmicort     Zari Cly A [employer]
